# Patient Record
Sex: Male | Born: 1970 | Race: White | Hispanic: No | Marital: Married | Smoking: Never smoker
Health system: Southern US, Community
[De-identification: ages and names within clinical notes are randomized; demographics above are authoritative.]

## PROBLEM LIST (undated history)

## (undated) ENCOUNTER — Ambulatory Visit: Admission: EM | Payer: BC Managed Care – PPO | Source: Home / Self Care

## (undated) HISTORY — PX: EXCISION PERITONSILLAR CYST: SHX6266

---
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
## 2013-03-30 ENCOUNTER — Ambulatory Visit: Payer: Self-pay | Admitting: Emergency Medicine

## 2013-03-30 LAB — RAPID STREP-A WITH REFLX: MICRO TEXT REPORT: NEGATIVE

## 2013-04-02 LAB — BETA STREP CULTURE(ARMC)

## 2013-11-22 ENCOUNTER — Inpatient Hospital Stay: Payer: Self-pay | Admitting: Surgery

## 2013-11-22 LAB — URINALYSIS, COMPLETE
BLOOD: NEGATIVE
Bacteria: NONE SEEN
Bilirubin,UR: NEGATIVE
GLUCOSE, UR: NEGATIVE mg/dL (ref 0–75)
LEUKOCYTE ESTERASE: NEGATIVE
NITRITE: NEGATIVE
Ph: 5 (ref 4.5–8.0)
Protein: NEGATIVE
Specific Gravity: 1.012 (ref 1.003–1.030)
Squamous Epithelial: NONE SEEN
WBC UR: <1 /HPF (ref 0–5)

## 2013-11-22 LAB — CBC WITH DIFFERENTIAL/PLATELET
BASOS PCT: 0.4 %
Basophil #: 0.1 10*3/uL (ref 0.0–0.1)
EOS PCT: 1.1 %
Eosinophil #: 0.2 10*3/uL (ref 0.0–0.7)
HCT: 47.3 % (ref 40.0–52.0)
HGB: 16.1 g/dL (ref 13.0–18.0)
LYMPHS ABS: 2.2 10*3/uL (ref 1.0–3.6)
LYMPHS PCT: 14.7 %
MCH: 30.4 pg (ref 26.0–34.0)
MCHC: 34.1 g/dL (ref 32.0–36.0)
MCV: 89 fL (ref 80–100)
MONOS PCT: 7.7 %
Monocyte #: 1.2 x10 3/mm — ABNORMAL HIGH (ref 0.2–1.0)
NEUTROS ABS: 11.4 10*3/uL — AB (ref 1.4–6.5)
Neutrophil %: 76.1 %
Platelet: 118 10*3/uL — ABNORMAL LOW (ref 150–440)
RBC: 5.31 10*6/uL (ref 4.40–5.90)
RDW: 12.5 % (ref 11.5–14.5)
WBC: 15 10*3/uL — ABNORMAL HIGH (ref 3.8–10.6)

## 2013-11-22 LAB — COMPREHENSIVE METABOLIC PANEL
ALBUMIN: 4 g/dL (ref 3.4–5.0)
ALT: 46 U/L
ANION GAP: 10 (ref 7–16)
AST: 35 U/L (ref 15–37)
Alkaline Phosphatase: 153 U/L — ABNORMAL HIGH
BILIRUBIN TOTAL: 2.4 mg/dL — AB (ref 0.2–1.0)
BUN: 10 mg/dL (ref 7–18)
CO2: 30 mmol/L (ref 21–32)
Calcium, Total: 8.9 mg/dL (ref 8.5–10.1)
Chloride: 100 mmol/L (ref 98–107)
Creatinine: 1.02 mg/dL (ref 0.60–1.30)
EGFR (Non-African Amer.): >60
GLUCOSE: 88 mg/dL (ref 65–99)
Osmolality: 278 (ref 275–301)
POTASSIUM: 3.8 mmol/L (ref 3.5–5.1)
Sodium: 140 mmol/L (ref 136–145)
TOTAL PROTEIN: 8.1 g/dL (ref 6.4–8.2)

## 2013-11-22 LAB — TROPONIN I: Troponin-I: <0.02 ng/mL

## 2013-11-23 LAB — CBC WITH DIFFERENTIAL/PLATELET
Basophil #: 0 10*3/uL (ref 0.0–0.1)
Basophil %: 0.3 %
Eosinophil #: 0.2 10*3/uL (ref 0.0–0.7)
Eosinophil %: 1.3 %
HCT: 42.4 % (ref 40.0–52.0)
HGB: 14.5 g/dL (ref 13.0–18.0)
LYMPHS ABS: 1.5 10*3/uL (ref 1.0–3.6)
LYMPHS PCT: 11.7 %
MCH: 30.2 pg (ref 26.0–34.0)
MCHC: 34.4 g/dL (ref 32.0–36.0)
MCV: 88 fL (ref 80–100)
Monocyte #: 1 x10 3/mm (ref 0.2–1.0)
Monocyte %: 7.8 %
NEUTROS PCT: 78.9 %
Neutrophil #: 9.8 10*3/uL — ABNORMAL HIGH (ref 1.4–6.5)
Platelet: 109 10*3/uL — ABNORMAL LOW (ref 150–440)
RBC: 4.81 10*6/uL (ref 4.40–5.90)
RDW: 12.6 % (ref 11.5–14.5)
WBC: 12.4 10*3/uL — ABNORMAL HIGH (ref 3.8–10.6)

## 2013-11-23 LAB — COMPREHENSIVE METABOLIC PANEL
ALT: 35 U/L
AST: 19 U/L (ref 15–37)
Albumin: 3.3 g/dL — ABNORMAL LOW (ref 3.4–5.0)
Alkaline Phosphatase: 132 U/L — ABNORMAL HIGH
Anion Gap: 8 (ref 7–16)
BUN: 8 mg/dL (ref 7–18)
Bilirubin,Total: 2.6 mg/dL — ABNORMAL HIGH (ref 0.2–1.0)
CALCIUM: 8.4 mg/dL — AB (ref 8.5–10.1)
Chloride: 104 mmol/L (ref 98–107)
Co2: 29 mmol/L (ref 21–32)
Creatinine: 0.98 mg/dL (ref 0.60–1.30)
EGFR (African American): >60
EGFR (Non-African Amer.): >60
Glucose: 88 mg/dL (ref 65–99)
OSMOLALITY: 279 (ref 275–301)
POTASSIUM: 3.6 mmol/L (ref 3.5–5.1)
Sodium: 141 mmol/L (ref 136–145)
Total Protein: 6.8 g/dL (ref 6.4–8.2)

## 2013-11-24 LAB — CBC WITH DIFFERENTIAL/PLATELET
BASOS ABS: 0.1 10*3/uL (ref 0.0–0.1)
Basophil %: 0.9 %
Eosinophil #: 0.2 10*3/uL (ref 0.0–0.7)
Eosinophil %: 2.2 %
HCT: 40.7 % (ref 40.0–52.0)
HGB: 13.5 g/dL (ref 13.0–18.0)
Lymphocyte #: 1.6 10*3/uL (ref 1.0–3.6)
Lymphocyte %: 18.6 %
MCH: 29.8 pg (ref 26.0–34.0)
MCHC: 33.1 g/dL (ref 32.0–36.0)
MCV: 90 fL (ref 80–100)
Monocyte #: 0.6 x10 3/mm (ref 0.2–1.0)
Monocyte %: 7.3 %
NEUTROS ABS: 6.3 10*3/uL (ref 1.4–6.5)
Neutrophil %: 71 %
Platelet: 107 10*3/uL — ABNORMAL LOW (ref 150–440)
RBC: 4.52 10*6/uL (ref 4.40–5.90)
RDW: 12.5 % (ref 11.5–14.5)
WBC: 8.8 10*3/uL (ref 3.8–10.6)

## 2013-11-27 LAB — CULTURE, BLOOD (SINGLE)

## 2013-12-11 ENCOUNTER — Ambulatory Visit: Payer: Self-pay | Admitting: Surgery

## 2013-12-13 LAB — CLOSTRIDIUM DIFFICILE(ARMC)

## 2013-12-26 ENCOUNTER — Ambulatory Visit: Payer: Self-pay | Admitting: Gastroenterology

## 2014-01-15 ENCOUNTER — Ambulatory Visit: Payer: Self-pay | Admitting: Surgery

## 2014-01-15 LAB — CBC WITH DIFFERENTIAL/PLATELET
Basophil #: 0.1 10*3/uL (ref 0.0–0.1)
Basophil %: 0.9 %
EOS PCT: 2.3 %
Eosinophil #: 0.1 10*3/uL (ref 0.0–0.7)
HCT: 45.4 % (ref 40.0–52.0)
HGB: 15.1 g/dL (ref 13.0–18.0)
Lymphocyte #: 1.4 10*3/uL (ref 1.0–3.6)
Lymphocyte %: 21 %
MCH: 29.9 pg (ref 26.0–34.0)
MCHC: 33.3 g/dL (ref 32.0–36.0)
MCV: 90 fL (ref 80–100)
MONOS PCT: 5.8 %
Monocyte #: 0.4 x10 3/mm (ref 0.2–1.0)
Neutrophil #: 4.6 10*3/uL (ref 1.4–6.5)
Neutrophil %: 70 %
PLATELETS: 140 10*3/uL — AB (ref 150–440)
RBC: 5.05 10*6/uL (ref 4.40–5.90)
RDW: 12.9 % (ref 11.5–14.5)
WBC: 6.6 10*3/uL (ref 3.8–10.6)

## 2014-01-15 LAB — BASIC METABOLIC PANEL
ANION GAP: 6 — AB (ref 7–16)
BUN: 10 mg/dL (ref 7–18)
Calcium, Total: 9 mg/dL (ref 8.5–10.1)
Chloride: 105 mmol/L (ref 98–107)
Co2: 31 mmol/L (ref 21–32)
Creatinine: 1.05 mg/dL (ref 0.60–1.30)
GLUCOSE: 81 mg/dL (ref 65–99)
Osmolality: 281 (ref 275–301)
Potassium: 3.6 mmol/L (ref 3.5–5.1)
SODIUM: 142 mmol/L (ref 136–145)

## 2014-01-17 ENCOUNTER — Ambulatory Visit: Payer: Self-pay | Admitting: Surgery

## 2014-02-04 ENCOUNTER — Ambulatory Visit: Payer: Self-pay | Admitting: Urgent Care

## 2014-02-05 LAB — CLOSTRIDIUM DIFFICILE(ARMC)

## 2014-02-10 ENCOUNTER — Ambulatory Visit: Payer: Self-pay | Admitting: Urgent Care

## 2014-02-10 LAB — CLOSTRIDIUM DIFFICILE(ARMC)

## 2014-06-07 NOTE — H&P (Signed)
   Subjective/Chief Complaint Suprapubic/RLQ pain   History of Present Illness [name] is a pleasant 44 yo M who presents with 1 day of acute onset, worsening suprapubic, somewhat more RLQ pain.  Began acutely yesterday.  Worsening.  + fevers/chills.  + nausea/vomiting.  Last BM at 2 pm today which was normal.  Has never had before.  Worse with movement.  WBC 15, CT shows sigmoid colon inflammation and small pocket of contained air in pelvis.  Developed few wheals following IV contrast for CT scan, no other meds given.   Past History Tonsillectomy with peritonsillar abscess   Code Status Full Code   Past Med/Surgical Hx:  Denies medical history:   ALLERGIES:  No Known Allergies:   Family and Social History:  Family History Coronary Artery Disease  Hypertension  Cancer  Breast/colon cancer (colon cancers in advanced age)   Social History positive  tobacco, negative ETOH, + smokeless tobacco x 28 years   + Tobacco Current (within 1 year)   Place of Living Home  Here with significant other   Review of Systems:  Subjective/Chief Complaint Suprapubic pain, N/V, subjective fevers/chills   Fever/Chills Yes   Cough No   Sputum No   Abdominal Pain Yes   Diarrhea No   Constipation No   Nausea/Vomiting Yes   SOB/DOE No   Chest Pain No   Dysuria No   Tolerating Diet Nauseated  Vomiting   Physical Exam:  GEN well developed, well nourished, no acute distress   HEENT pink conjunctivae, PERRL, moist oral mucosa   RESP normal resp effort  clear BS  no use of accessory muscles   CARD regular rate  no murmur  no thrills  no carotid bruits  No LE edema   ABD positive tenderness  soft  normal BS  + suprapubic/infraumbilical tenderness   LYMPH negative neck, negative axillae   EXTR negative cyanosis/clubbing, negative edema   SKIN normal to palpation, + 3 wheals on abdomen   NEURO cranial nerves intact, negative Babinski R/L, negative rigidity, negative tremor,  follows commands   PSYCH A+O to time, place, person, good insight    Assessment/Admission Diagnosis 44 yo with acute onset abdominal pain, leukocytosis, sigmoid colon inflammation with contained perforation, likely diverticular.  Also with hyperbilirubinemia of unknown etiology.  Clinical and radiographic diverticulitis.   Plan NPO, IVF, IV abx.  Labs in am.  Serial abd exams.  Will need colonoscopy in 6 weeks.   Electronic Signatures: Jarvis NewcomerLundquist, Marrion Accomando A (MD)  (Signed 09-Oct-15 22:39)  Authored: CHIEF COMPLAINT and HISTORY, PAST MEDICAL/SURGIAL HISTORY, ALLERGIES, FAMILY AND SOCIAL HISTORY, REVIEW OF SYSTEMS, PHYSICAL EXAM, ASSESSMENT AND PLAN   Last Updated: 09-Oct-15 22:39 by Jarvis NewcomerLundquist, Karolyn Messing A (MD)

## 2016-08-16 IMAGING — CT CT ABD-PELV W/ CM
2 of 5 series · 16 of 46 positions shown, 18 images · IV contrast (isovue)
Comparison: 11/22/2013

CLINICAL DATA: History of ulcerative colitis, abdominal generalized
pain, colonoscopy 2 weeks ago

EXAM:
CT ABDOMEN AND PELVIS WITH CONTRAST
TECHNIQUE: Multidetector CT imaging of the abdomen and pelvis was performed
using the standard protocol following bolus administration of
intravenous contrast.
The patient has contrast allergy. There patient was premedicated
prior to IV contrast. No reaction to contrast.
CONTRAST:  100 cc Isovue

[Series 2: routine abd pel with · axial · 0.75mm/px · z∈[-1052,-652]mm · 13 of 90 slices shown, 15 images]
[im 5/90  soft-tissue]
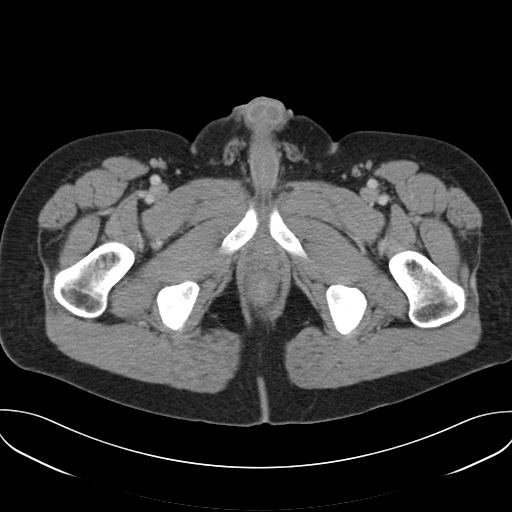
[im 5/90  bone]
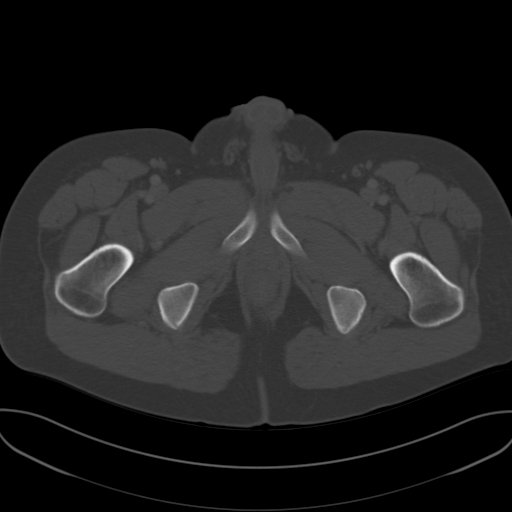
[im 10/90  soft-tissue]
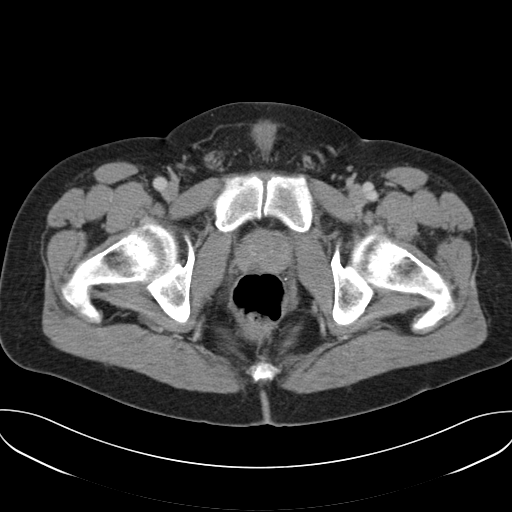
[im 20/90  soft-tissue]
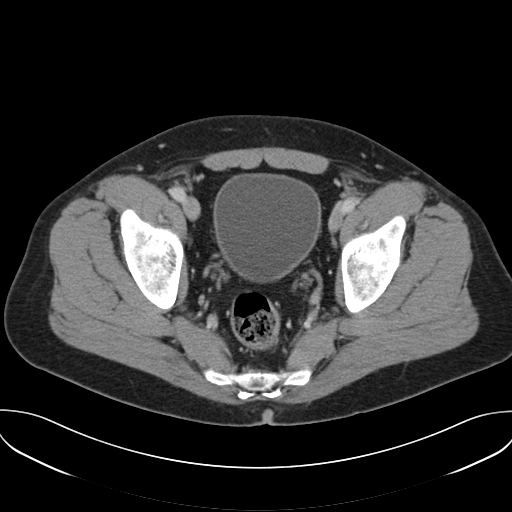
[im 25/90  soft-tissue]
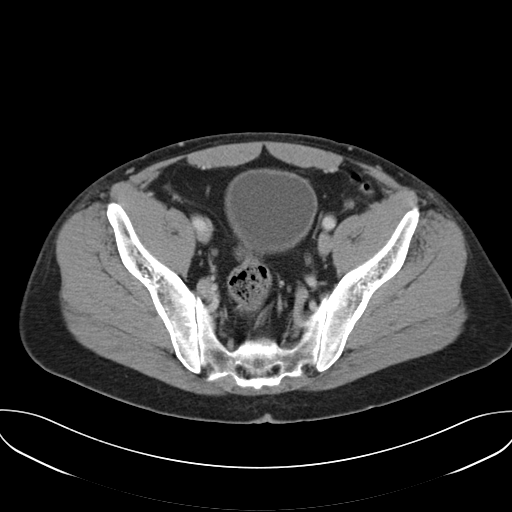
[im 30/90  soft-tissue]
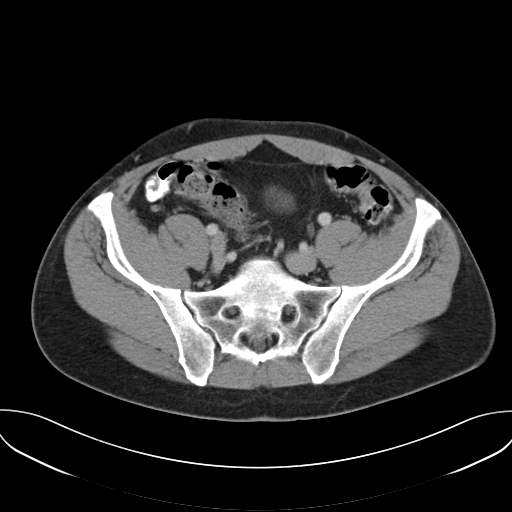
[im 40/90  soft-tissue]
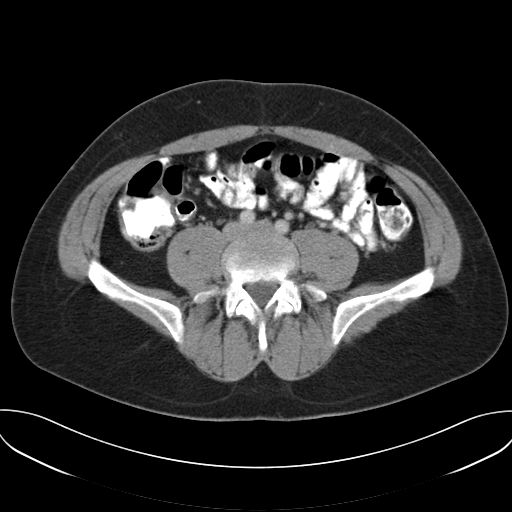
[im 45/90  soft-tissue]
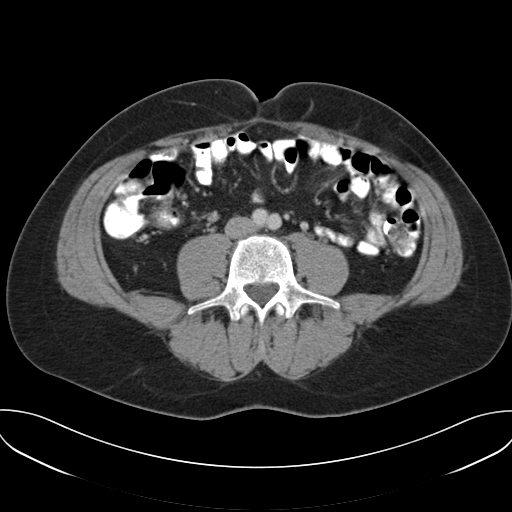
[im 50/90  soft-tissue]
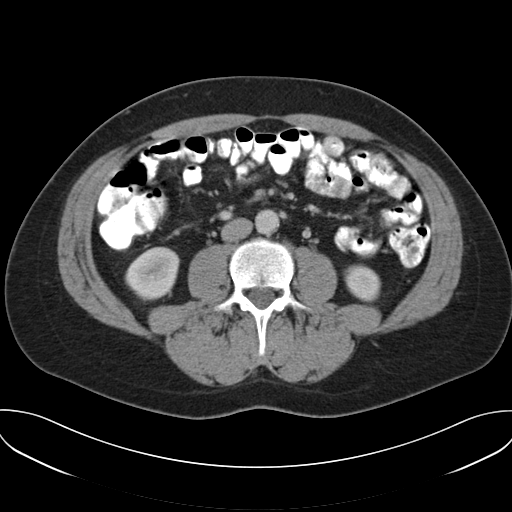
[im 60/90  soft-tissue]
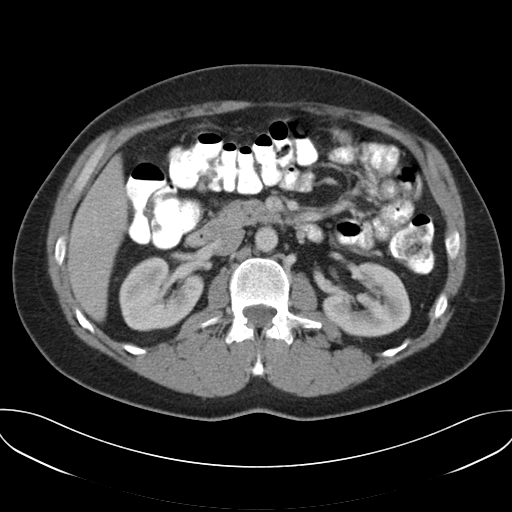
[im 60/90  bone]
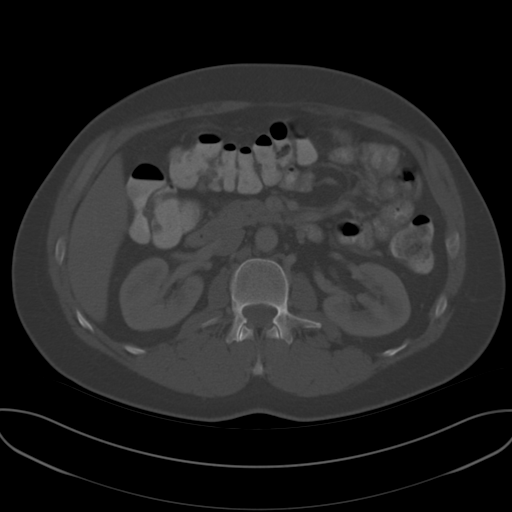
[im 65/90  soft-tissue]
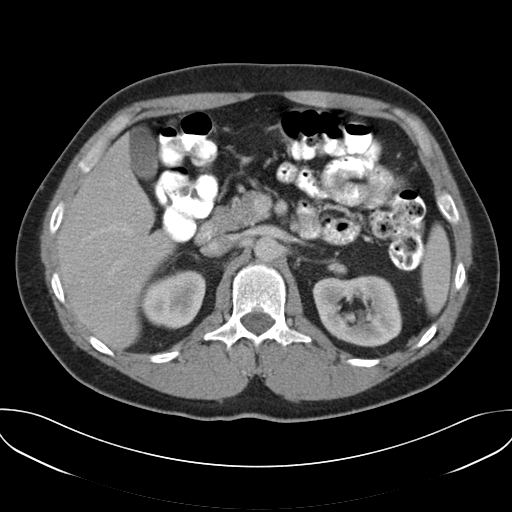
[im 70/90  soft-tissue]
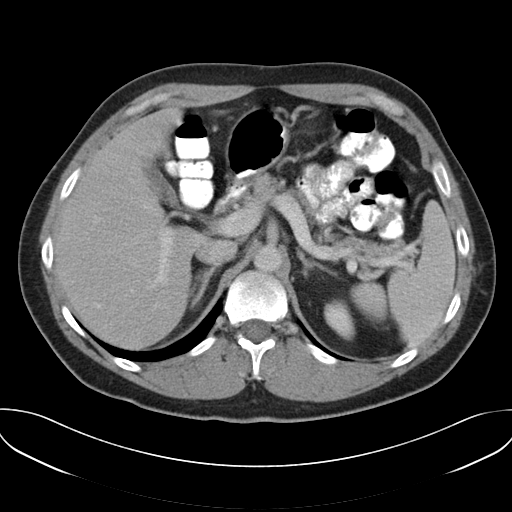
[im 80/90  soft-tissue]
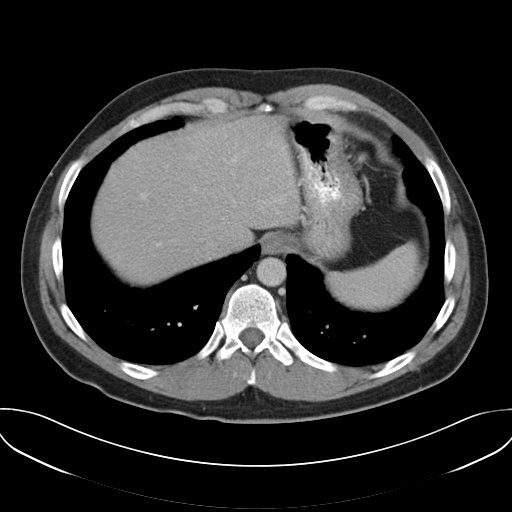
[im 85/90  soft-tissue]
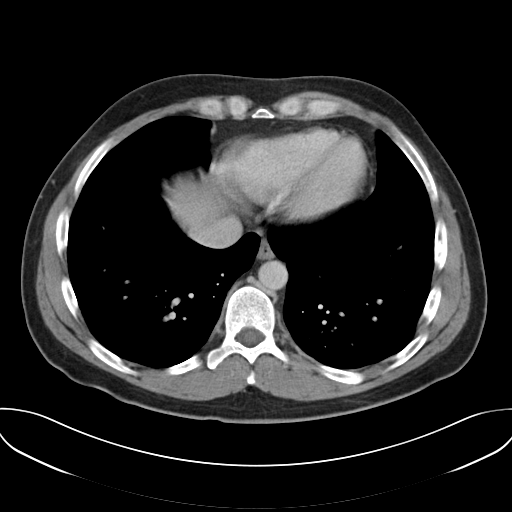

[Series 6: cor routine abd pel with · coronal · 0.70mm/px · 3 of 139 slices shown]
[im 47/139  soft-tissue]
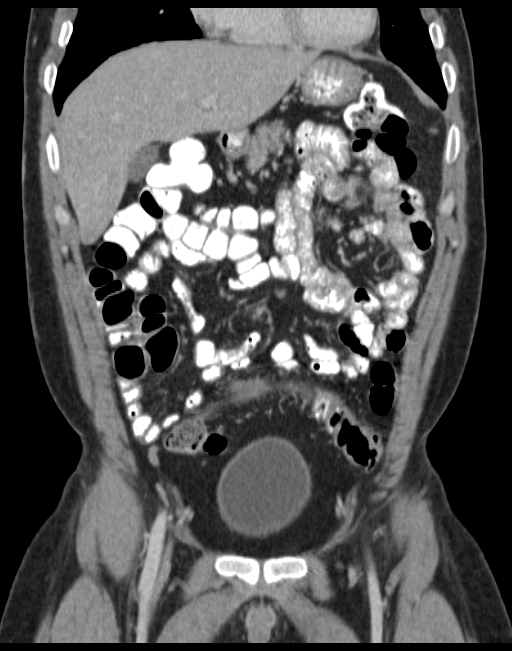
[im 62/139  soft-tissue]
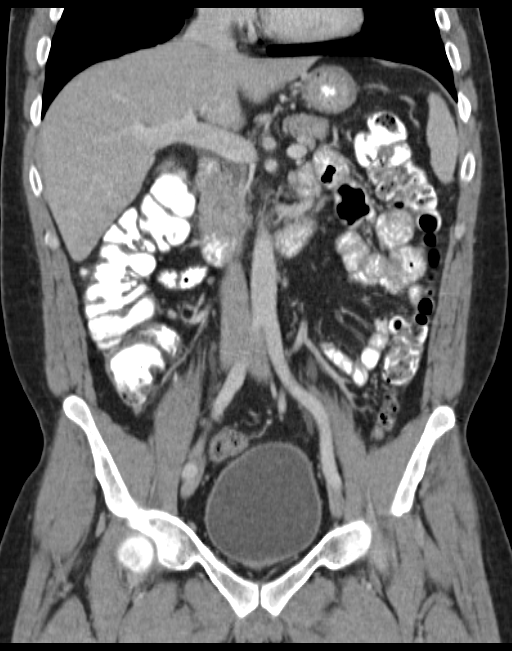
[im 77/139  soft-tissue]
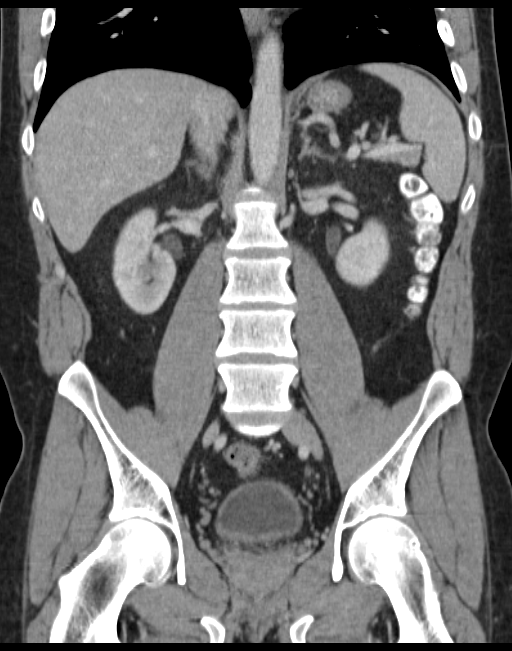

[16 of 46 positions shown; findings below may reference images not displayed]

FINDINGS: Sagittal images of the spine are unremarkable.

The lung bases are unremarkable.

Enhanced liver, pancreas, spleen and adrenal glands are
unremarkable. Kidneys are symmetrical in size and enhancement. No
hydronephrosis or hydroureter. No calcified gallstones are noted
within gallbladder. Abdominal aorta is unremarkable.

No small bowel obstruction. No adenopathy. No ascites or free air.
Terminal ileum is unremarkable. Normal appendix clearly visualized.
There is some stool in distal sigmoid colon. No evidence of colitis
or diverticulitis. Previous perforated diverticulitis has resolved.
The urinary bladder is unremarkable. Bilateral distal ureter is
unremarkable. Prostate gland and seminal vesicles are unremarkable.

Delayed renal images shows bilateral renal symmetrical excretion.
Bilateral visualized proximal ureter is unremarkable.
IMPRESSION: 1. No acute inflammatory process within abdomen or pelvis.
2. Normal appendix.  No pericecal inflammation.
3. No colitis or diverticulitis. Some stool noted in distal sigmoid
colon and rectum.
4. No hydronephrosis or hydroureter. Bilateral renal symmetrical
excretion.

## 2017-03-26 ENCOUNTER — Other Ambulatory Visit: Payer: Self-pay

## 2017-03-26 ENCOUNTER — Ambulatory Visit
Admission: EM | Admit: 2017-03-26 | Discharge: 2017-03-26 | Disposition: A | Discharge status: Home / Self Care | Payer: BLUE CROSS/BLUE SHIELD | Attending: Family Medicine | Admitting: Family Medicine

## 2017-03-26 DIAGNOSIS — R059 Cough, unspecified: Secondary | ICD-10-CM

## 2017-03-26 DIAGNOSIS — R05 Cough: Secondary | ICD-10-CM

## 2017-03-26 DIAGNOSIS — J0100 Acute maxillary sinusitis, unspecified: Secondary | ICD-10-CM

## 2017-03-26 MED ORDER — AMOXICILLIN 875 MG PO TABS: 875.0000 mg | ORAL_TABLET | Freq: Two times a day (BID) | ORAL | 0 refills | Status: DC

## 2017-03-26 NOTE — ED Provider Notes (Signed)
[facility]    CSN: [id number] Arrival date & time: 03/26/17  1409     History   Chief Complaint Chief Complaint  Patient presents with  . Cough    HPI Rodney Weber is a 47 y.o. male.   The history is provided by the patient.  Cough  Associated symptoms: no wheezing   URI  Presenting symptoms: congestion, cough, facial pain and fatigue   Severity:  Moderate Onset quality:  Sudden Duration:  3 weeks Timing:  Constant Progression:  Worsening Chronicity:  New Relieved by:  Nothing Ineffective treatments:  OTC medications Associated symptoms: sinus pain   Associated symptoms: no wheezing   Risk factors: sick contacts   Risk factors: not elderly, no chronic cardiac disease, no chronic kidney disease, no chronic respiratory disease, no diabetes mellitus, no immunosuppression, no recent illness and no recent travel     History reviewed. No pertinent past medical history.  There are no active problems to display for this patient.   Past Surgical History:  Procedure Laterality Date  . EXCISION PERITONSILLAR CYST         Home Medications    Prior to Admission medications   Medication Sig Start Date End Date Taking? Authorizing Provider  amoxicillin (AMOXIL) 875 MG tablet Take 1 tablet (875 mg total) by mouth 2 (two) times daily. 03/26/17   Payton Mccallumonty, Epsie Walthall, MD    Family History History reviewed. No pertinent family history.  Social History Social History   Tobacco Use  . Smoking status: Never Smoker  . Smokeless tobacco: Current User    Types: Chew  Substance Use Topics  . Alcohol use: Yes    Comment: rare  . Drug use: No     Allergies   Other   Review of Systems Review of Systems  Constitutional: Positive for fatigue.  HENT: Positive for congestion and sinus pain.   Respiratory: Positive for cough. Negative for wheezing.      Physical Exam Triage Vital Signs ED Triage Vitals  Enc Vitals Group     BP 03/26/17 1436 102/67       Pulse Rate 03/26/17 1436 64     Resp --      Temp 03/26/17 1436 98.2 F (36.8 C)     Temp Source 03/26/17 1436 Oral     SpO2 03/26/17 1436 99 %     Weight 03/26/17 1433 215 lb (97.5 kg)     Height 03/26/17 1433 6' (1.829 m)     Head Circumference --      Peak Flow --      Pain Score 03/26/17 1509 0     Pain Loc --      Pain Edu? --      Excl. in GC? --    No data found.  Updated Vital Signs BP 102/67 (BP Location: Left Arm)   Pulse 64   Temp 98.2 F (36.8 C) (Oral)   Ht 6' (1.829 m)   Wt 215 lb (97.5 kg)   SpO2 99%   BMI 29.16 kg/m   Visual Acuity Right Eye Distance:   Left Eye Distance:   Bilateral Distance:    Right Eye Near:   Left Eye Near:    Bilateral Near:     Physical Exam  Constitutional: He appears well-developed and well-nourished. No distress.  HENT:  Head: Normocephalic and atraumatic.  Right Ear: Tympanic membrane, external ear and ear canal normal.  Left Ear: Tympanic membrane, external ear and ear canal normal.  Nose: Mucosal edema and rhinorrhea present. Right sinus exhibits maxillary sinus tenderness and frontal sinus tenderness. Left sinus exhibits maxillary sinus tenderness and frontal sinus tenderness.  Mouth/Throat: Uvula is midline, oropharynx is clear and moist and mucous membranes are normal. No oropharyngeal exudate or tonsillar abscesses.  Eyes: Conjunctivae and EOM are normal. Pupils are equal, round, and reactive to light. Right eye exhibits no discharge. Left eye exhibits no discharge. No scleral icterus.  Neck: Normal range of motion. Neck supple. No tracheal deviation present. No thyromegaly present.  Cardiovascular: Normal rate, regular rhythm and normal heart sounds.  Pulmonary/Chest: Effort normal and breath sounds normal. No stridor. No respiratory distress. He has no wheezes. He has no rales. He exhibits no tenderness.  Lymphadenopathy:    He has no cervical adenopathy.  Neurological: He is alert.  Skin: Skin is warm and  dry. No rash noted. He is not diaphoretic.  Nursing note and vitals reviewed.    UC Treatments / Results  Labs (all labs ordered are listed, but only abnormal results are displayed) Labs Reviewed - No data to display  EKG  EKG Interpretation None       Radiology No results found.  Procedures Procedures (including critical care time)  Medications Ordered in UC Medications - No data to display   Initial Impression / Assessment and Plan / UC Course  I have reviewed the triage vital signs and the nursing notes.  Pertinent labs & imaging results that were available during my care of the patient were reviewed by me and considered in my medical decision making (see chart for details).       Final Clinical Impressions(s) / UC Diagnoses   Final diagnoses:  Acute maxillary sinusitis, recurrence not specified  Cough    ED Discharge Orders        Ordered    amoxicillin (AMOXIL) 875 MG tablet  2 times daily     03/26/17 1507     1. diagnosis reviewed with patient 2. rx as per orders above; reviewed possible side effects, interactions, risks and benefits  3. Recommend supportive treatment with otc flonase, otc cough med 4. Follow-up prn if symptoms worsen or don't improve  Controlled Substance Prescriptions [place] Controlled Substance Registry consulted? Not Applicable   Payton Mccallum, MD 03/26/17 [phone]

## 2017-03-26 NOTE — ED Triage Notes (Signed)
Patient c/o cough and congestion x 3 weeks.

## 2018-03-17 ENCOUNTER — Encounter: Payer: Self-pay | Admitting: Gynecology

## 2018-03-17 ENCOUNTER — Ambulatory Visit
Admission: EM | Admit: 2018-03-17 | Discharge: 2018-03-17 | Disposition: A | Discharge status: Home / Self Care | Payer: BLUE CROSS/BLUE SHIELD | Attending: Emergency Medicine | Admitting: Emergency Medicine

## 2018-03-17 ENCOUNTER — Other Ambulatory Visit: Payer: Self-pay

## 2018-03-17 DIAGNOSIS — J069 Acute upper respiratory infection, unspecified: Secondary | ICD-10-CM | POA: Diagnosis not present

## 2018-03-17 LAB — RAPID INFLUENZA A&B ANTIGENS
Influenza A (ARMC): NEGATIVE
Influenza B (ARMC): NEGATIVE

## 2018-03-17 LAB — RAPID STREP SCREEN (MED CTR MEBANE ONLY): Streptococcus, Group A Screen (Direct): NEGATIVE

## 2018-03-17 MED ORDER — IBUPROFEN 600 MG PO TABS: 600.0000 mg | ORAL_TABLET | Freq: Four times a day (QID) | ORAL | 0 refills | Status: DC | PRN

## 2018-03-17 MED ORDER — HYDROCOD POLST-CPM POLST ER 10-8 MG/5ML PO SUER: 5.0000 mL | Freq: Two times a day (BID) | ORAL | 0 refills | Status: DC | PRN

## 2018-03-17 MED ORDER — FLUTICASONE PROPIONATE 50 MCG/ACT NA SUSP: 2.0000 | Freq: Every day | NASAL | 0 refills | Status: DC

## 2018-03-17 NOTE — ED Provider Notes (Signed)
HPI  SUBJECTIVE:  Rodney Weber is a 48 y.o. male who presents with nasal congestion, rhinorrhea, postnasal drip, sore throat, headaches for the past 4 to 5 days.  States that he has felt feverish with chills, but did not check his temperature.  He reports a raspy voice and a cough productive of yellowish-brown mucus in the morning, clear in the afternoon.  States it is the same material as his nasal congestion.  No body aches, neck stiffness, drooling, trismus, sensation of throat swelling shut, difficulty breathing.  No wheezing, chest pain, shortness of breath.  No abdominal pain, rash.  He did not get a flu shot this year.  No contacts with strep.  States that he is unable to sleep at night secondary to the cough.  No antibiotics in the past month.  No antipyretic in the past 4 to 6 hours.  He tried Coricidin high blood pressure, and Zicam without improvement in his symptoms.  No aggravating factors.  He has a past medical history of peritonsillar abscess status post tonsillectomy.  No history of hypertension, pulmonary disease, smoking, diabetes, allergies, GERD.  PMD: None.    History reviewed. No pertinent past medical history.  Past Surgical History:  Procedure Laterality Date  . EXCISION PERITONSILLAR CYST      History reviewed. No pertinent family history.  Social History   Tobacco Use  . Smoking status: Never Smoker  . Smokeless tobacco: Current User    Types: Chew  Substance Use Topics  . Alcohol use: Yes    Comment: rare  . Drug use: No    No current facility-administered medications for this encounter.   Current Outpatient Medications:  .  amoxicillin (AMOXIL) 875 MG tablet, Take 1 tablet (875 mg total) by mouth 2 (two) times daily., Disp: 20 tablet, Rfl: 0  Allergies  Allergen Reactions  . Other     CT reactive dye causes rash     ROS  As noted in HPI.   Physical Exam  BP 111/79 (BP Location: Left Arm)   Pulse 77   Temp 99.2 F (37.3 C) (Oral)    Resp 16   Ht 6' (1.829 m)   Wt 95.3 kg   SpO2 100%   BMI 28.48 kg/m   Constitutional: Well developed, well nourished, no acute distress Eyes:  EOMI, conjunctiva normal bilaterally HENT: Normocephalic, atraumatic,mucus membranes moist.  Mucoid nasal congestion.  Erythematous, but not swollen turbinates.  No maxillary or frontal sinus tenderness.  Tonsils surgically absent.  Slightly erythematous oropharynx.  No cobblestoning, postnasal drip. Neck: No cervical lymphadenopathy. Respiratory: Normal inspiratory effort lungs clear bilaterally, good air movement Cardiovascular: Normal rate and rhythm, no murmurs GI: nondistended skin: No rash, skin intact Musculoskeletal: no deformities Neurologic: Alert & oriented x 3, no focal neuro deficits Psychiatric: Speech and behavior appropriate   ED Course   Medications - No data to display  Orders Placed This Encounter  Procedures  . Rapid Strep Screen ([facility] ONLY)    Standing Status:   Standing    Number of Occurrences:   1  . Rapid Influenza A&B Antigens ([facility] only)    Standing Status:   Standing    Number of Occurrences:   1  . Culture, group A strep    Standing Status:   Standing    Number of Occurrences:   1  . Droplet precaution    Standing Status:   Standing    Number of Occurrences:   1  Results for orders placed or performed during the hospital encounter of 03/17/18 (from the past 24 hour(s))  Rapid Strep Screen ([facility] ONLY)     Status: None   Collection Time: 03/17/18  3:52 PM  Result Value Ref Range   Streptococcus, Group A Screen (Direct) NEGATIVE NEGATIVE  Rapid Influenza A&B Antigens ([facility] only)     Status: None   Collection Time: 03/17/18  3:52 PM  Result Value Ref Range   Influenza A ([facility]) NEGATIVE NEGATIVE   Influenza B ([facility]) NEGATIVE NEGATIVE   No results found.  ED Clinical Impression  Upper respiratory tract infection, unspecified type   ED Assessment/Plan  [place] Narcotic database  reviewed for this patient, and feel that the risk/benefit ratio today is favorable for proceeding with a prescription for controlled substance.  No opiate prescriptions in 2 years.  Flu, strep negative.  Presentation consistent with URI.  Home with supportive treatment including regular Mucinex, saline nasal irrigation, ibuprofen 600 mg combined with 1 g of Tylenol 3 or 4 times a day as needed, Flonase, Tussionex.  Will provide primary care referral list.  May return here in 5 to 6 days if not feeling better we can consider antibiotics at that time.   Discussed labs,, MDM, treatment plan, and plan for follow-up with patient. patient agrees with plan.   No orders of the defined types were placed in this encounter.   *This clinic note was created using Dragon dictation software. Therefore, there may be occasional mistakes despite careful proofreading.   ?    Domenick Gong, MD 03/17/18 [phone]

## 2018-03-17 NOTE — ED Triage Notes (Signed)
Patient c/o cough Rodney Weber throat/ fever.

## 2018-03-17 NOTE — Discharge Instructions (Addendum)
regular Mucinex, saline nasal irrigation, ibuprofen 600 mg combined with 1 g of Tylenol 3 or 4 times a day as needed, Flonase, Tussionex.  You can stop the Coricidin high blood pressure.  May return here in 5 to 6 days if not feeling better we can consider antibiotics at that time.  Otherwise, follow-up with a primary care physician of your choice.  See list below.  Here is a list of primary care providers who are taking new patients:  Dr. Elizabeth Sauer, Dr. Schuyler Amor [address] [place] [address] [phone]  [facility] [address] [place] [address]  [phone]  [facility] [address] [place], [place] [address] [phone]  [facility] [address] [place]  [phone] [place], [place] [address]  Here are clinics/ other resources who will see you if you do not have insurance. Some have certain criteria that you must meet. Call them and find out what they are:  [facility]: [address] [place], [place] [address] Phone: [phone] Hours: First and Third Saturdays of each Month, 9 a.m. - 1 p.m.  [facility]: [address], [address] [place], [place] [address] Phone: [phone] Hours: Tuesday, 4 p.m. - 8 p.m. Thursday, 1 p.m. - 8 p.m. Wednesday, 9 a.m. - [facility] [address] [place], [place] [address] Phone: [phone] Pharmacy Phone Number: [phone] Dental Phone Number: [phone] [facility] Insurance Help: [phone]  Dental Hours: Monday - Thursday, 8 a.m. - 6 p.m.  [facility] [address] [place], [place] [address] Phone: [phone] Pharmacy Phone Number: [phone] [facility] Insurance Help: [phone]  [facility] [address] [place], [place] [address] Phone: [phone] Pharmacy Phone Number: [phone] [facility] Insurance Help: [phone]  [facility] [address] [place], [place] [address] Phone: [phone] [facility] Insurance Help: [phone]   [facility] [address] [place], [place] [address] Phone: [phone]  Go to www.goodrx.com to look up your medications. This will give you a list of where you can find your prescriptions at the most affordable prices. Or ask the pharmacist what the cash price is, or if they have any other discount programs available to help make your medication more affordable. This can be less expensive than what you would pay with insurance.

## 2018-03-20 LAB — CULTURE, GROUP A STREP (THRC)

## 2020-10-01 ENCOUNTER — Ambulatory Visit
Admission: EM | Admit: 2020-10-01 | Discharge: 2020-10-01 | Disposition: A | Discharge status: Home / Self Care | Payer: BC Managed Care – PPO | Attending: Family Medicine | Admitting: Family Medicine

## 2020-10-01 ENCOUNTER — Other Ambulatory Visit: Payer: Self-pay

## 2020-10-01 DIAGNOSIS — J988 Other specified respiratory disorders: Secondary | ICD-10-CM | POA: Diagnosis not present

## 2020-10-01 DIAGNOSIS — U071 COVID-19: Secondary | ICD-10-CM | POA: Insufficient documentation

## 2020-10-01 MED ORDER — CETIRIZINE-PSEUDOEPHEDRINE ER 5-120 MG PO TB12: 1.0000 | ORAL_TABLET | Freq: Two times a day (BID) | ORAL | 0 refills | Status: DC

## 2020-10-01 MED ORDER — AMOXICILLIN-POT CLAVULANATE 875-125 MG PO TABS: 1.0000 | ORAL_TABLET | Freq: Two times a day (BID) | ORAL | 0 refills | Status: DC

## 2020-10-01 NOTE — ED Triage Notes (Signed)
Pt here with C/O Chest congestion, nasal congestion, cough since Monday, sore throat since this morning. At home test negative. Has tried OTC medication Claritin and cough drops with no relief.

## 2020-10-01 NOTE — ED Provider Notes (Signed)
[facility]    CSN: [id number] Arrival date & time: 10/01/20  0841      History   Chief Complaint Chief Complaint  Patient presents with   Cough   Sore Throat   Nasal Congestion    HPI 50 year old male presents with respiratory symptoms.  Started on Monday.  He reports dry cough, postnasal drip, congestion, sore throat, ear pain.  No fever.  At home COVID test negative.  No reported sick contacts.  He has tried Claritin and some cough drops without resolution.  Noted exacerbating factors.  No other complaints or concerns at this time.   Home Medications    Prior to Admission medications   Medication Sig Start Date End Date Taking? Authorizing Provider  amoxicillin-clavulanate (AUGMENTIN) 875-125 MG tablet Take 1 tablet by mouth 2 (two) times daily. 10/01/20  Yes Adrinne Sze G, DO  cetirizine-pseudoephedrine (ZYRTEC-D) 5-120 MG tablet Take 1 tablet by mouth 2 (two) times daily. 10/01/20  Yes Tommie Sams, DO   Social History Social History   Tobacco Use   Smoking status: Never   Smokeless tobacco: Current    Types: Chew  Substance Use Topics   Alcohol use: Yes    Comment: rare   Drug use: No     Allergies   Other   Review of Systems Review of Systems Per HPI  Physical Exam Triage Vital Signs ED Triage Vitals  Enc Vitals Group     BP 10/01/20 0858 102/71     Pulse Rate 10/01/20 0858 66     Resp 10/01/20 0858 16     Temp 10/01/20 0858 98.4 F (36.9 C)     Temp Source 10/01/20 0858 Oral     SpO2 10/01/20 0858 100 %     Weight 10/01/20 0856 215 lb (97.5 kg)     Height 10/01/20 0856 6' (1.829 m)     Head Circumference --      Peak Flow --      Pain Score 10/01/20 0856 5     Pain Loc --      Pain Edu? --      Excl. in GC? --    Updated Vital Signs BP 102/71 (BP Location: Right Arm)   Pulse 66   Temp 98.4 F (36.9 C) (Oral)   Resp 16   Ht 6' (1.829 m)   Wt 97.5 kg   SpO2 100%   BMI 29.16 kg/m   Visual Acuity Right Eye Distance:    Left Eye Distance:   Bilateral Distance:    Right Eye Near:   Left Eye Near:    Bilateral Near:     Physical Exam Constitutional:      General: He is not in acute distress.    Appearance: Normal appearance. He is well-developed. He is not ill-appearing.  HENT:     Head: Normocephalic and atraumatic.     Right Ear: Tympanic membrane normal.     Left Ear: Tympanic membrane normal.     Mouth/Throat:     Pharynx: Posterior oropharyngeal erythema present.  Eyes:     General:        Right eye: No discharge.        Left eye: No discharge.     Conjunctiva/sclera: Conjunctivae normal.  Cardiovascular:     Rate and Rhythm: Normal rate and regular rhythm.     Heart sounds: No murmur heard. Pulmonary:     Effort: Pulmonary effort is normal.     Breath  sounds: Normal breath sounds. No wheezing, rhonchi or rales.  Neurological:     Mental Status: He is alert.  Psychiatric:        Mood and Affect: Mood normal.        Behavior: Behavior normal.     UC Treatments / Results  Labs (all labs ordered are listed, but only abnormal results are displayed) Labs Reviewed  SARS CORONAVIRUS 2 (TAT 6-24 HRS)    EKG   Radiology No results found.  Procedures Procedures (including critical care time)  Medications Ordered in UC Medications - No data to display  Initial Impression / Assessment and Plan / UC Course  I have reviewed the triage vital signs and the nursing notes.  Pertinent labs & imaging results that were available during my care of the patient were reviewed by me and considered in my medical decision making (see chart for details).    50 year old male presents with respiratory infection.  Awaiting COVID test results.  Placing empirically on Augmentin and Zyrtec-D.  Final Clinical Impressions(s) / UC Diagnoses   Final diagnoses:  Respiratory infection     Discharge Instructions      Medication as prescribed.  Results from the COVID test should be back  tomorrow.  Take care  Dr. Adriana Simas    ED Prescriptions     Medication Sig Dispense Auth. Provider   amoxicillin-clavulanate (AUGMENTIN) 875-125 MG tablet Take 1 tablet by mouth 2 (two) times daily. 14 tablet Wang Granada G, DO   cetirizine-pseudoephedrine (ZYRTEC-D) 5-120 MG tablet Take 1 tablet by mouth 2 (two) times daily. 30 tablet Tommie Sams, DO      PDMP not reviewed this encounter.   Tommie Sams, DO 10/01/20 1027

## 2020-10-01 NOTE — Discharge Instructions (Addendum)
Medication as prescribed.  Results from the COVID test should be back tomorrow.  Take care  Dr. Adriana Simas

## 2020-10-02 LAB — SARS CORONAVIRUS 2 (TAT 6-24 HRS): SARS Coronavirus 2: POSITIVE — AB

## 2021-03-05 ENCOUNTER — Ambulatory Visit: Payer: Self-pay | Admitting: Urology

## 2021-03-09 ENCOUNTER — Ambulatory Visit: Payer: BC Managed Care – PPO | Admitting: Urology

## 2021-03-09 ENCOUNTER — Other Ambulatory Visit: Payer: Self-pay

## 2021-03-09 ENCOUNTER — Encounter: Payer: Self-pay | Admitting: Urology

## 2021-03-09 ENCOUNTER — Other Ambulatory Visit
Admission: RE | Admit: 2021-03-09 | Discharge: 2021-03-09 | Disposition: A | Discharge status: Home / Self Care | Payer: BC Managed Care – PPO | Attending: Urology | Admitting: Urology

## 2021-03-09 VITALS — BP 118/72 | HR 78 | Ht 72.0 in | Wt 218.0 lb

## 2021-03-09 DIAGNOSIS — Z8042 Family history of malignant neoplasm of prostate: Secondary | ICD-10-CM

## 2021-03-09 DIAGNOSIS — N529 Male erectile dysfunction, unspecified: Secondary | ICD-10-CM | POA: Insufficient documentation

## 2021-03-09 DIAGNOSIS — Z125 Encounter for screening for malignant neoplasm of prostate: Secondary | ICD-10-CM | POA: Diagnosis not present

## 2021-03-09 MED ORDER — TADALAFIL 5 MG PO TABS: 5.0000 mg | ORAL_TABLET | Freq: Every day | ORAL | 11 refills | Status: DC

## 2021-03-09 NOTE — Progress Notes (Signed)
° °  03/09/21 10:17 AM   Rodney Weber 1970/05/24 [id number]  CC: PSA screening, family history of prostate cancer, ED  HPI: Healthy 51 year old male here to discuss the above issues.  He reportedly has a history of lethal prostate cancer in his father, and has a number of questions today about PSA screening.  PSA has been normal, most recently 1.08 in April 2022 which was stable from 1.15 the year prior.  He has his PSA drawn yearly in April with PCP visit.  He denies any significant urinary symptoms.  His main complaint today is erectile dysfunction over at least the last year.  He has never tried medications for this.  He has difficulty both obtaining and maintaining an erection.  No prior testosterone values to review.    Surgical History: Past Surgical History:  Procedure Laterality Date   EXCISION PERITONSILLAR CYST        Family History: No family history on file.  Social History:  reports that he has never smoked. His smokeless tobacco use includes chew. He reports current alcohol use. He reports that he does not use drugs.  Physical Exam: BP 118/72    Pulse 78    Ht 6' (1.829 m)    Wt 218 lb (98.9 kg)    BMI 29.57 kg/m    Constitutional:  Alert and oriented, No acute distress. Cardiovascular: No clubbing, cyanosis, or edema. Respiratory: Normal respiratory effort, no increased work of breathing. GI: Abdomen is soft, nontender, nondistended, no abdominal masses DRE: 30 g, smooth, no nodules or masses  Assessment & Plan:   51 year old male with family history of lethal prostate cancer here for routine PSA screening/DRE, as well as concerns for erectile dysfunction.  Reviewed the AUA guidelines regarding PSA screening, and his DRE is benign today.  I recommended continuing screening every 1 to 2 years with PCP.  He would like to have a yearly DRE performed, and his PCP has not performed these, and he would like to continue to see [place] yearly for a DRE.  We reviewed the  risks and benefits of screening at length.  Regarding his erectile dysfunction, I recommended a trial of Cialis 5 to 10 mg daily.  We discussed this dose can be titrated as needed, and if working well can ultimately just be taken as needed.  We also reviewed the AUA guidelines regarding ED that recommend a testosterone level, and will call with those results from today.  Trial of Cialis 5 to 10 mg daily for ED, he will contact us via MyChart or phone if no improvement Call with testosterone results RTC May 2024 for ongoing PSA surveillance, DRE, and ED symptom check  Legrand Rams, MD 03/09/2021  [facility] [address] [place], [place] [address] [phone]

## 2021-03-09 NOTE — Patient Instructions (Addendum)
PLEASE CALL BACK TO SCHEDULE APPOINTMENT IN MAY OF 2024   Prostate Cancer Screening Prostate cancer screening is testing that is done to check for the presence of prostate cancer in men. The prostate gland is a walnut-sized gland that is located below the bladder and in front of the rectum in males. The function of the prostate is to add fluid to semen during ejaculation. Prostate cancer is one of the most common types of cancer in men. Who should have prostate cancer screening? Screening recommendations vary based on age and other risk factors, as well as between the professional organizations who make the recommendations. In general, screening is recommended if: You are age 49 to 31 and have an average risk for prostate cancer. You should talk with your health care provider about your need for screening and how often screening should be done. Because most prostate cancers are slow growing and will not cause death, screening in this age group is generally reserved for men who have a 10- to 15-year life expectancy. You are younger than age 67, and you have these risk factors: Having a father, brother, or uncle who has been diagnosed with prostate cancer. The risk is higher if your family member's cancer occurred at an early age or if you have multiple family members with prostate cancer at an early age. Being a male who is [place] or is of [place] or sub-Saharan African descent. In general, screening is not recommended if: You are younger than age 59. You are between the ages of 63 and 31 and you have no risk factors. You are 65 years of age or older. At this age, the risks that screening can cause are greater than the benefits that it may provide. If you are at high risk for prostate cancer, your health care provider may recommend that you have screenings more often or that you start screening at a younger age. How is screening for prostate cancer done? The recommended prostate cancer screening  test is a blood test called the prostate-specific antigen (PSA) test. PSA is a protein that is made in the prostate. As you age, your prostate naturally produces more PSA. Abnormally high PSA levels may be caused by: Prostate cancer. An enlarged prostate that is not caused by cancer (benign prostatic hyperplasia, or BPH). This condition is very common in older men. A prostate gland infection (prostatitis) or urinary tract infection. Certain medicines such as male hormones (like testosterone) or other medicines that raise testosterone levels. A rectal exam may be done as part of prostate cancer screening to help provide information about the size of your prostate gland. When a rectal exam is performed, it should be done after the PSA level is drawn to avoid any effect on the results. Depending on the PSA results, you may need more tests, such as: A physical exam to check the size of your prostate gland, if not done as part of screening. Blood and imaging tests. A procedure to remove tissue samples from your prostate gland for testing (biopsy). This is the only way to know for certain if you have prostate cancer. What are the benefits of prostate cancer screening? Screening can help to identify cancer at an early stage, before symptoms start and when the cancer can be treated more easily. There is a small chance that screening may lower your risk of dying from prostate cancer. The chance is small because prostate cancer is a slow-growing cancer, and most men with prostate cancer die from a  different cause. What are the risks of prostate cancer screening? The main risk of prostate cancer screening is diagnosing and treating prostate cancer that would never have caused any symptoms or problems. This is called overdiagnosisand overtreatment. PSA screening cannot tell you if your PSA is high due to cancer or a different cause. A prostate biopsy is the only procedure to diagnose prostate cancer. Even the  results of a biopsy may not tell you if your cancer needs to be treated. Slow-growing prostate cancer may not need any treatment other than monitoring, so diagnosing and treating it may cause unnecessary stress or other side effects. Questions to ask your health care provider When should I start prostate cancer screening? What is my risk for prostate cancer? How often do I need screening? What type of screening tests do I need? How do I get my test results? What do my results mean? Do I need treatment? Where to find more information The American Cancer Society: www.cancer.org American Urological Association: www.auanet.org Contact a health care provider if: You have difficulty urinating. You have pain when you urinate or ejaculate. You have blood in your urine or semen. You have pain in your back or in the area of your prostate. Summary Prostate cancer is a common type of cancer in men. The prostate gland is located below the bladder and in front of the rectum. This gland adds fluid to semen during ejaculation. Prostate cancer screening may identify cancer at an early stage, when the cancer can be treated more easily and is less likely to have spread to other areas of the body. The prostate-specific antigen (PSA) test is the recommended screening test for prostate cancer, but it has associated risks. Discuss the risks and benefits of prostate cancer screening with your health care provider. If you are age 104 or older, the risks that screening can cause are greater than the benefits that it may provide. This information is not intended to replace advice given to you by your health care provider. Make sure you discuss any questions you have with your health care provider. Document Revised: 07/27/2020 Document Reviewed: 07/27/2020 Elsevier Patient Education  2022 Elsevier Inc.  Prostate-Specific Antigen Test Why am I having this test? The prostate-specific antigen (PSA) test is a screening  test for prostate cancer. It can identify early signs of prostate cancer, which may allow for early detection and more effective treatment. Your health care provider may recommend that you have a PSA test starting at age 62 or that you have one earlier if you are at higher risk for prostate cancer. You may also have a PSA test: To monitor treatment of prostate cancer. To check whether prostate cancer has returned after treatment. What is being tested? This test measures the amount of PSA in your blood. PSA is a protein that is made in the prostate. The prostate naturally produces more PSA as you age, but very high levels may be a sign of a medical condition. What kind of sample is taken? A blood sample is required for this test. It is usually collected by inserting a needle into a blood vessel but can also be collected by sticking a finger with a small needle. Blood for this test should be drawn before having an exam of the prostate that involves digital rectal examination to avoid affecting the results. How do I prepare for this test? Do not ejaculate starting 24 hours before your test, or as long as told by your health care provider, as  this can cause an elevation in PSA. Do not undergo any procedures that require manipulation of the prostate, such as biopsy or surgery, for 6 weeks before the test is done as this can cause an elevation in PSA. Tell a health care provider about: Any signs you may have of other conditions that can affect PSA levels, such as: An enlarged prostate that is not caused by cancer (benign prostatic hyperplasia, or BPH). This condition is very common in older men. A prostate or urinary tract infection. Any allergies you have. All medicines you are taking, including vitamins, herbs, eye drops, creams, and over-the-counter medicines. This also includes: Medicines to assist with hair growth, such as finasteride. Any recent exposure to a medicine called diethylstilbestrol  (DES). Medicines such as male hormones (like testosterone) or other medicines that raise testosterone levels. Any bleeding problems you have. Any recent procedures you have had, especially any procedures involving the prostate or rectum. Any medical conditions you have. How are the results reported? Your test results will be reported as a value that indicates how much PSA is in your blood. This will be given as nanograms of PSA per milliliter of blood (ng/mL). Your health care provider will compare your results to normal ranges that were established after testing a large group of people (reference ranges). Reference ranges may vary among labs and hospitals. PSA levels vary from person to person and generally increase with age. Because of this variation, there is no single PSA value that is considered normal for everyone. Instead, PSA reference ranges are used to describe whether your PSA levels are considered low or high (elevated). Common reference ranges are: Low: 0-2.5 ng/mL. Slightly to moderately elevated: 2.6-10.0 ng/mL. Moderately elevated: 10.0-19.9 ng/mL. Significantly elevated: 20 ng/mL or greater. What do the results mean? A test result that is higher than 4 ng/mL may mean that you have prostate cancer. However, a PSA test by itself is not enough to diagnose prostate cancer. High PSA levels may also be caused by the natural aging process, prostate infection (prostatitis), or BPH. PSA screening cannot tell you if your PSA is high due to cancer or a different cause. A prostate biopsy is the only way to diagnose prostate cancer. A risk of having the PSA test is diagnosing and treating prostate cancer that would never have caused any symptoms or problems (overdiagnosis and overtreatment). Talk with your health care provider about what your results mean. In some cases, your health care provider may do more testing to confirm the results. Questions to ask your health care provider Ask your  health care provider, or the department that is doing the test: When will my results be ready? How will I get my results? What are my treatment options? What other tests do I need? What are my next steps? Summary The prostate-specific antigen (PSA) test is a screening test for prostate cancer. Your health care provider may recommend that you have a PSA test starting at age 79 or that you have one earlier if you are at higher risk for prostate cancer. A test result that is higher than 4 ng/mL may mean that you have prostate cancer. However, elevated levels can be caused by a number of conditions other than prostate cancer. Talk with your health care provider about what your results mean. This information is not intended to replace advice given to you by your health care provider. Make sure you discuss any questions you have with your health care provider. Document Revised: 06/10/2020 Document  Reviewed: 06/10/2020 Elsevier Patient Education  2022 Elsevier Inc.   Erectile Dysfunction Erectile dysfunction (ED) is the inability to get or keep an erection in order to have sexual intercourse. ED is considered a symptom of an underlying disorder and is not considered a disease. ED may include: Inability to get an erection. Lack of enough hardness of the erection to allow penetration. Loss of erection before sex is finished. What are the causes? This condition may be caused by: Physical causes, such as: Artery problems. This may include heart disease, high blood pressure, atherosclerosis, and diabetes. Hormonal problems, such as low testosterone. Obesity. Nerve problems. This may include back or pelvic injuries, multiple sclerosis, Parkinson's disease, spinal cord injury, and stroke. Certain medicines, such as: Pain relievers. Antidepressants. Blood pressure medicines and water pills (diuretics). Cancer medicines. Antihistamines. Muscle relaxants. Lifestyle factors, such as: Use of drugs  such as marijuana, cocaine, or opioids. Excessive use of alcohol. Smoking. Lack of physical activity or exercise. Psychological causes, such as: Anxiety or stress. Sadness or depression. Exhaustion. Fear about sexual performance. Guilt. What are the signs or symptoms? Symptoms of this condition include: Inability to get an erection. Lack of enough hardness of the erection to allow penetration. Loss of the erection before sex is finished. Sometimes having normal erections, but with frequent unsatisfactory episodes. Low sexual satisfaction in either partner due to erection problems. A curved penis occurring with erection. The curve may cause pain, or the penis may be too curved to allow for intercourse. Never having nighttime or morning erections. How is this diagnosed? This condition is often diagnosed by: Performing a physical exam to find other diseases or specific problems with the penis. Asking you detailed questions about the problem. Doing tests, such as: Blood tests to check for diabetes mellitus or high cholesterol, or to measure hormone levels. Other tests to check for underlying health conditions. An ultrasound exam to check for scarring. A test to check blood flow to the penis. Doing a sleep study at home to measure nighttime erections. How is this treated? This condition may be treated by: Medicines, such as: Medicine taken by mouth to help you achieve an erection (oral medicine). Hormone replacement therapy to replace low testosterone levels. Medicine that is injected into the penis. Your health care provider may instruct you how to give yourself these injections at home. Medicine that is delivered with a short applicator tube. The tube is inserted into the opening at the tip of the penis, which is the opening of the urethra. A tiny pellet of medicine is put in the urethra. The pellet dissolves and enhances erectile function. This is also called MUSE (medicated urethral  system for erections) therapy. Vacuum pump. This is a pump with a ring on it. The pump and ring are placed on the penis and used to create pressure that helps the penis become erect. Penile implant surgery. In this procedure, you may receive: An inflatable implant. This consists of cylinders, a pump, and a reservoir. The cylinders can be inflated with a fluid that helps to create an erection, and they can be deflated after intercourse. A semi-rigid implant. This consists of two silicone rubber rods. The rods provide some rigidity. They are also flexible, so the penis can both curve downward in its normal position and become straight for sexual intercourse. Blood vessel surgery to improve blood flow to the penis. During this procedure, a blood vessel from a different part of the body is placed into the penis to allow  blood to flow around (bypass) damaged or blocked blood vessels. Lifestyle changes, such as exercising more, losing weight, and quitting smoking. Follow these instructions at home: Medicines  Take over-the-counter and prescription medicines only as told by your health care provider. Do not increase the dosage without first discussing it with your health care provider. If you are using self-injections, do injections as directed by your health care provider. Make sure you avoid any veins that are on the surface of the penis. After giving an injection, apply pressure to the injection site for 5 minutes. Talk to your health care provider about how to prevent headaches while taking ED medicines. These medicines may cause a sudden headache due to the increase in blood flow in your body. General instructions Exercise regularly, as directed by your health care provider. Work with your health care provider to lose weight, if needed. Do not use any products that contain nicotine or tobacco. These products include cigarettes, chewing tobacco, and vaping devices, such as e-cigarettes. If you need help  quitting, ask your health care provider. Before using a vacuum pump, read the instructions that come with the pump and discuss any questions with your health care provider. Keep all follow-up visits. This is important. Contact a health care provider if: You feel nauseous. You are vomiting. You get sudden headaches while taking ED medicines. You have any concerns about your sexual health. Get help right away if: You are taking oral or injectable medicines and you have an erection that lasts longer than 4 hours. If your health care provider is unavailable, go to the nearest emergency room for evaluation. An erection that lasts much longer than 4 hours can result in permanent damage to your penis. You have severe pain in your groin or abdomen. You develop redness or severe swelling of your penis. You have redness spreading at your groin or lower abdomen. You are unable to urinate. You experience chest pain or a rapid heartbeat (palpitations) after taking oral medicines. These symptoms may represent a serious problem that is an emergency. Do not wait to see if the symptoms will go away. Get medical help right away. Call your local emergency services (911 in the [place] Do not drive yourself to the hospital. Summary Erectile dysfunction (ED) is the inability to get or keep an erection during sexual intercourse. This condition is diagnosed based on a physical exam, your symptoms, and tests to determine the cause. Treatment varies depending on the cause and may include medicines, hormone therapy, surgery, or a vacuum pump. You may need follow-up visits to make sure that you are using your medicines or devices correctly. Get help right away if you are taking or injecting medicines and you have an erection that lasts longer than 4 hours. This information is not intended to replace advice given to you by your health care provider. Make sure you discuss any questions you have with your health care  provider. Document Revised: 04/29/2020 Document Reviewed: 04/29/2020 Elsevier Patient Education  2022 [employer]

## 2021-03-10 LAB — TESTOSTERONE: Testosterone: 417 ng/dL (ref 264–916)

## 2021-03-11 ENCOUNTER — Telehealth: Payer: Self-pay

## 2021-03-11 NOTE — Telephone Encounter (Signed)
See my chart message

## 2021-03-11 NOTE — Telephone Encounter (Signed)
-----   Message from Sondra Come, MD sent at 03/10/2021  8:14 AM EST ----- Good news, testosterone normal at 417.  If no improvement on the Cialis, okay to titrate dose up to 20 mg max dose, he should let us know via MyChart or phone if no improvement over the next month  Legrand Rams, MD 03/10/2021

## 2022-01-24 ENCOUNTER — Encounter: Payer: BC Managed Care – PPO | Admitting: Family Medicine

## 2022-01-27 ENCOUNTER — Encounter: Payer: Self-pay | Admitting: Family Medicine

## 2022-01-27 ENCOUNTER — Ambulatory Visit: Payer: BC Managed Care – PPO | Admitting: Family Medicine

## 2022-01-27 VITALS — BP 110/70 | HR 78 | Ht 72.0 in | Wt 223.0 lb

## 2022-01-27 DIAGNOSIS — M7711 Lateral epicondylitis, right elbow: Secondary | ICD-10-CM | POA: Diagnosis not present

## 2022-01-27 MED ORDER — MELOXICAM 15 MG PO TABS: 15.0000 mg | ORAL_TABLET | Freq: Every day | ORAL | 0 refills | Status: DC

## 2022-01-27 NOTE — Progress Notes (Signed)
     Primary Care / Sports Medicine Office Visit  Patient Information:  Patient ID: Rodney Weber, male DOB: May 25, 1970 Age: 51 y.o. MRN: [id number]   Rodney Weber is a pleasant 51 y.o. male presenting with the following:  Chief Complaint  Patient presents with   Elbow Pain    Numbness, weakness, right, for 3 months. No known injury. No imaging.     Vitals:   01/27/22 0812  BP: 110/70  Pulse: 78  SpO2: 98%   Vitals:   01/27/22 0812  Weight: 223 lb (101.2 kg)  Height: 6' (1.829 m)   Body mass index is 30.24 kg/m.  No results found.   Independent interpretation of notes and tests performed by another provider:   None  Procedures performed:   None  Pertinent History, Exam, Impression, and Recommendations:   Problem List Items Addressed This Visit       Musculoskeletal and Integument   Lateral epicondylitis, right elbow - Primary    RHD patient with 3 months atraumatic right lateral elbow and extensor forearm pain, progressive worsening. Has been resting and performing rehab exercises without resolution, dosing 800 mg ibuprofen daily as well.  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 Plan for counterforce brace, scheduled meloxicam, home exercises provided for start at 2-4 weeks if symptoms permit. Recalcitrant symptoms to be addressed with corticosteroid injection with consideration for formal PT.      Relevant Medications   meloxicam (MOBIC) 15 MG tablet     Orders & Medications Meds ordered this encounter  Medications   meloxicam (MOBIC) 15 MG tablet    Sig: Take 1 tablet (15 mg total) by mouth daily.    Dispense:  30 tablet    Refill:  0   No orders of the defined types were placed in this encounter.    No follow-ups on file.     Jerrol Banana, MD, [facility]   Primary Care Sports Medicine Primary Care and Sports Medicine at [facility]

## 2022-01-27 NOTE — Patient Instructions (Signed)
-   Use brace during wakeful / active hours - Start meloxicam and dose daily with food

## 2022-01-27 NOTE — Assessment & Plan Note (Signed)
RHD patient with 3 months atraumatic right lateral elbow and extensor forearm pain, progressive worsening. Has been resting and performing rehab exercises without resolution, dosing 800 mg ibuprofen daily as well.  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 Plan for counterforce brace, scheduled meloxicam, home exercises provided for start at 2-4 weeks if symptoms permit. Recalcitrant symptoms to be addressed with corticosteroid injection with consideration for formal PT.

## 2022-02-24 ENCOUNTER — Encounter: Payer: Self-pay | Admitting: Family Medicine

## 2022-02-24 ENCOUNTER — Ambulatory Visit: Payer: Commercial Managed Care - PPO | Admitting: Family Medicine

## 2022-02-24 VITALS — BP 110/70 | HR 76 | Ht 72.0 in | Wt 223.0 lb

## 2022-02-24 DIAGNOSIS — M7711 Lateral epicondylitis, right elbow: Secondary | ICD-10-CM

## 2022-02-24 MED ORDER — MELOXICAM 7.5 MG PO TABS: 7.5000 mg | ORAL_TABLET | Freq: Two times a day (BID) | ORAL | 1 refills | Status: AC | PRN

## 2022-02-24 NOTE — Progress Notes (Signed)
     Primary Care / Sports Medicine Office Visit  Patient Information:  Patient ID: CHARON AKAMINE, male DOB: 1971/02/08 Age: 52 y.o. MRN: [id number]   EIDAN MUELLNER is a pleasant 52 y.o. male presenting with the following:  Chief Complaint  Patient presents with   Lateral epicondylitis, right elbow    Is getting some relief, has not started PT    Vitals:   02/24/22 0824  BP: 110/70  Pulse: 76  SpO2: 98%   Vitals:   02/24/22 0824  Weight: 223 lb (101.2 kg)  Height: 6' (1.829 m)   Body mass index is 30.24 kg/m.  No results found.   Independent interpretation of notes and tests performed by another provider:   None  Procedures performed:   None  Pertinent History, Exam, Impression, and Recommendations:   Sasuke was seen today for lateral epicondylitis, right elbow.  Lateral epicondylitis, right elbow Assessment & Plan: Patient presents for follow-up to right lateral epicondylitis, at last visit close/14/2023 he was advised scheduled meloxicam regimen, counterforce brace, home exercises.  He cites roughly 10-15% improvement over interim but still faces significant interference with ADLs due to pain.  Examination with focal tenderness at the right lateral epicondyle, secondarily at the triceps insertion at the olecranon, medial epicondyle benign, provocative testing again consistent with lateral epicondylitis.  We spent a length of time discussing various next steps inclusive of continuing current management, incorporation of physical therapy, consideration of dry needling, Graston, inclusion of corticosteroid versus platelet rich plasma injections.  At this stage patient will start formal PT, consider dry needling and Graston techniques, seek information from his insurer regarding consideration of platelet rich plasma, and continue counterforce brace.  From a medication management standpoint transition meloxicam 50 mg daily to meloxicam 7.5 mg twice daily as  needed.  Orders: -     Ambulatory referral to Physical Therapy -     Meloxicam; Take 1 tablet (7.5 mg total) by mouth 2 (two) times daily as needed for pain.  Dispense: 60 tablet; Refill: 1   I provided a total time of 30 minutes including both face-to-face and non-face-to-face time on 02/24/2022 inclusive of time utilized for medical chart review, information gathering, care coordination with staff, and documentation completion.   Orders & Medications Meds ordered this encounter  Medications   meloxicam (MOBIC) 7.5 MG tablet    Sig: Take 1 tablet (7.5 mg total) by mouth 2 (two) times daily as needed for pain.    Dispense:  60 tablet    Refill:  1   Orders Placed This Encounter  Procedures   Ambulatory referral to Physical Therapy     Return in about 6 weeks (around 04/07/2022).     Montel Culver, MD, [facility]   Primary Care Sports Medicine Primary Care and Sports Medicine at [facility]

## 2022-02-24 NOTE — Assessment & Plan Note (Signed)
Patient presents for follow-up to right lateral epicondylitis, at last visit close/14/2023 he was advised scheduled meloxicam regimen, counterforce brace, home exercises.  He cites roughly 10-15% improvement over interim but still faces significant interference with ADLs due to pain.  Examination with focal tenderness at the right lateral epicondyle, secondarily at the triceps insertion at the olecranon, medial epicondyle benign, provocative testing again consistent with lateral epicondylitis.  We spent a length of time discussing various next steps inclusive of continuing current management, incorporation of physical therapy, consideration of dry needling, Graston, inclusion of corticosteroid versus platelet rich plasma injections.  At this stage patient will start formal PT, consider dry needling and Graston techniques, seek information from his insurer regarding consideration of platelet rich plasma, and continue counterforce brace.  From a medication management standpoint transition meloxicam 50 mg daily to meloxicam 7.5 mg twice daily as needed.

## 2022-02-24 NOTE — Patient Instructions (Addendum)
- Transition to as needed dosing of meloxicam 7.5 mg up to twice a day (take with food) - Can trial ice and Tylenol for additional pain control - Start physical therapy, recommend dry needling in addition to standard treatments - Continue elbow brace and activity as tolerated - Touch base with your insurance company regarding CPT 0232T (PRP injection) and reach out to [place] to provide an update - Can review the following PRP information - Return for follow-up in 6 weeks    Platelet-Rich Plasma (PRP) Patient Information  Platelet-rich plasma is used in musculoskeletal medicine to focus your own body's ability to heal. It has several well-done published randomized control trials (RCT) which demonstrate both its effectiveness and safety in many musculoskeletal conditions, including osteoarthritis, tendinopathies, and damaged vertebral discs. PRP has been in clinical use since the 1990's.  Many people know that platelets form a clot if there is a cut in the skin. It turns out that platelets do not only form a clot, but they also start the body's own repair process. When platelets activate to form a clot, they also release alpha granules which have hundreds of chemical messengers in them that initiate and organize repair to the damaged tissue.  Precisely placing PRP at the site of injury will initiate the healing process by activating on the damaged cartilage or tendon. This is an inflammatory process, and inflammation is the vital first phase of healing.  What to expect and how to prepare for PRP   2 weeks prior to the procedure: depending on the procedure, you may need to arrange for a driver to bring you home. IF you are having a lower extremity procedure, we can provide crutches as needed.   7 days prior to the procedure: Stop taking anti-inflammatory drugs like ibuprofen, Naprosyn, Celebrex, or Meloxicam. Let Dr. Zigmund Daniel know if you have been taking prednisone or other corticosteroids in the  last month.   The day before the procedure: thoroughly shower and clean your skin.    The day of the procedure: Wear loose-fitting clothing like sweatpants or shorts. If you are having an upper body procedure wear a top that can button or zip up.  PRP will initiate healing and a productive inflammation, and PRP therapy will make the body part treated sore for 4 days to two weeks.  Anti-inflammatory drugs (i.e. ibuprofen, Naprosyn, Celebrex) and corticosteroids such as prednisone can blunt or stop this process, so it is important to not take any anti-inflammatory drugs for 7 days before getting PRP therapy, and for at least 3 weeks after PRP therapy.  Corticosteroid injections can blunt inflammation for 30 days, so let us know if you have had one recently.  Depending on the body part injected, you may be in a sling or on crutches for several days. Just like wringing out a wet dishcloth, if you load or tense a tendon or ligament that has just been injected with PRP, some of the PRP injected can squeeze out. By keeping the body part treated relaxed for a few days, the PRP can bind in place and do its job.   You may need a driver to bring you home.  Tobacco/nicotine is a potent toxin and its use constricts small blood vessels which are needed for tissue repair. Tobacco/nicotine use will limit the effectiveness of any treatment and stopping tobacco use is one of the single greatest actions you can take to improve your health. Avoid toxins like alcohol, which inhibits and depresses the cells  needed for tissue repair.  What happens during the PRP procedure?  Platelet-rich plasma is made by taking some of your blood and performing a two-stage centrifuge process on it to concentrate the PRP. First, your blood is drawn into a syringe with a small amount of anti-coagulant in it (this is to keep the blood from clotting during this process). The amount of blood drawn is usually about 10-30 milliliters,  depending on how much PRP is needed for the treatment. (There are 355 milliliters in a 12-ounce soda can for comparison).   Then the blood is transferred in a sterile fashion into a centrifuge tube. It is then centrifuged for the first cycle where the red blood cells are isolated and discarded. In the second centrifuge cycle, the platelet-rich fraction of the remaining plasma is concentrated and placed in a syringe.  The skin at the injection site is numbed with a small amount of topical cooling spray. Dr Zigmund Daniel will then precisely inject the PRP into the injury site using ultrasound guidance.  What to do after your procedure  I will give you specific medicine to control any discomfort you may have after the procedure.  Avoid NSAIDs like ibuprofen.  Acetaminophen can be used for mild pain.   Do your best not to tense or load the treated area during this time. After 3 days, unless otherwise instructed, the treated body part should be used and slowly moved through its full range of motion. It will be sore, but you will not be doing damage by moving it, in fact it needs to move to heal.  If you were on crutches for some time, walking is okay once you are off the crutches. For now, avoid activities that specifically hurt you before being treated. Exercise is vital to good health and finding a way to cross train around your injury is important not only for your physical health, but for your mental health as well. Ask me about cross training options for your injury.  Some brief (10 minutes or less) period of heat or ice therapy will not hurt the therapy, but it is not required.  Usually, depending on the initial injury, physical therapy is started from two weeks to four weeks after injection. Improvements in pain and function should be expected from 8 weeks to 12 weeks after injection and some injuries may require more than one treatment.   Montel Culver, MD, [facility]   Primary Care Sports  Medicine Primary Care and Sports Medicine at [facility]

## 2022-04-07 ENCOUNTER — Ambulatory Visit: Payer: BC Managed Care – PPO | Admitting: Family Medicine

## 2023-02-24 ENCOUNTER — Encounter: Payer: Self-pay | Admitting: Emergency Medicine

## 2023-02-24 ENCOUNTER — Ambulatory Visit
Admission: EM | Admit: 2023-02-24 | Discharge: 2023-02-24 | Disposition: A | Discharge status: Home / Self Care | Payer: Commercial Managed Care - PPO | Attending: Family Medicine | Admitting: Family Medicine

## 2023-02-24 DIAGNOSIS — J40 Bronchitis, not specified as acute or chronic: Secondary | ICD-10-CM

## 2023-02-24 DIAGNOSIS — J329 Chronic sinusitis, unspecified: Secondary | ICD-10-CM | POA: Diagnosis not present

## 2023-02-24 MED ORDER — PREDNISONE 10 MG (21) PO TBPK: ORAL_TABLET | Freq: Every day | ORAL | 0 refills | Status: AC

## 2023-02-24 MED ORDER — AMOXICILLIN-POT CLAVULANATE 875-125 MG PO TABS: 1.0000 | ORAL_TABLET | Freq: Two times a day (BID) | ORAL | 0 refills | Status: AC

## 2023-02-24 NOTE — ED Provider Notes (Signed)
 [facility]    CSN: [id number] Arrival date & time: 02/24/23  0945      History   Chief Complaint Chief Complaint  Patient presents with   Cough    HPI Rodney Weber is a 53 y.o. male.   HPI  History obtained from the patient. Kitt presents for chest congestion, nasal congestion with productive cough and non-bloody sputum. Took Mucinex and coricidin HBP with some short term relief.  His kids and wife have been sick with similar sx but he has had it the longest.  He felt warm but didn't think he had a fever.    No asthma history.  States he scarring.   History reviewed. No pertinent past medical history.  Patient Active Problem List   Diagnosis Date Noted   Lateral epicondylitis, right elbow 01/27/2022    Past Surgical History:  Procedure Laterality Date   EXCISION PERITONSILLAR CYST  1989       Home Medications    Prior to Admission medications   Medication Sig Start Date End Date Taking? Authorizing Provider  amoxicillin -clavulanate (AUGMENTIN ) 875-125 MG tablet Take 1 tablet by mouth every 12 (twelve) hours. 02/24/23  Yes Rodney Geralds, DO  predniSONE  (STERAPRED UNI-PAK 21 TAB) 10 MG (21) TBPK tablet Take by mouth daily. Take 6 tabs by mouth daily for 1, then 5 tabs for 1 day, then 4 tabs for 1 day, then 3 tabs for 1 day, then 2 tabs for 1 day, then 1 tab for 1 day. 02/24/23  Yes Rodney Bernhard, DO  meloxicam  (MOBIC ) 7.5 MG tablet Take 1 tablet (7.5 mg total) by mouth 2 (two) times daily as needed for pain. 02/24/22   Rodney Selinda PARAS, MD    Family History History reviewed. No pertinent family history.  Social History Social History   Tobacco Use   Smoking status: Never   Smokeless tobacco: Current    Types: Chew  Vaping Use   Vaping status: Never Used  Substance Use Topics   Alcohol use: Yes    Comment: rare   Drug use: No     Allergies   Iodinated contrast media and Other   Review of Systems Review of Systems: negative  unless otherwise stated in HPI.      Physical Exam Triage Vital Signs ED Triage Vitals  Encounter Vitals Group     BP 02/24/23 1018 110/78     Systolic BP Percentile --      Diastolic BP Percentile --      Pulse Rate 02/24/23 1018 75     Resp 02/24/23 1018 15     Temp 02/24/23 1018 98.8 F (37.1 C)     Temp Source 02/24/23 1018 Oral     SpO2 02/24/23 1018 100 %     Weight 02/24/23 1017 223 lb 1.7 oz (101.2 kg)     Height 02/24/23 1017 6' (1.829 m)     Head Circumference --      Peak Flow --      Pain Score 02/24/23 1017 0     Pain Loc --      Pain Education --      Exclude from Growth Chart --    No data found.  Updated Vital Signs BP 110/78 (BP Location: Left Arm)   Pulse 75   Temp 98.8 F (37.1 C) (Oral)   Resp 15   Ht 6' (1.829 m)   Wt 101.2 kg   SpO2 100%   BMI 30.26 kg/m  Visual Acuity Right Eye Distance:   Left Eye Distance:   Bilateral Distance:    Right Eye Near:   Left Eye Near:    Bilateral Near:     Physical Exam GEN:     alert, well appearing male in no distress    HENT:  mucus membranes moist, oropharyngeal without lesions, mild erythema, no tonsillar hypertrophy or exudates,  moderate erythematous edematous turbinates, clear nasal discharge, bilateral TM normal EYES:   no scleral injection or discharge RESP:  no increased work of breathing, faint expiratory wheezing at the bases CVS:   regular rate and rhythm Skin:   warm and dry, no rash on visible skin    UC Treatments / Results  Labs (all labs ordered are listed, but only abnormal results are displayed) Labs Reviewed - No data to display  EKG   Radiology No results found.  Procedures Procedures (including critical care time)  Medications Ordered in UC Medications - No data to display  Initial Impression / Assessment and Plan / UC Course  I have reviewed the triage vital signs and the nursing notes.  Pertinent labs & imaging results that were available during my care of  the patient were reviewed by me and considered in my medical decision making (see chart for details).       Pt is a 53 y.o. male who presents for 3 weeks of cough that is not improving.  Rodney Weber is  afebrile here without recent antipyretics. Satting  well on room air. Overall pt is  non-toxic appearing, well hydrated, without respiratory distress. Pulmonary exam is remarkable for faint expiratory wheezing at the lung bases.  Patient declined a chest x-ray.  COVID  and influenza testing deferred due to length of symptoms.   Treat acute sinobronchitis with steroids and antibiotics as below. Typical duration of symptoms discussed. Return and ED precautions given and patient voiced understanding.   Discussed MDM, treatment plan and plan for follow-up with patient who agrees with plan.     Final Clinical Impressions(s) / UC Diagnoses   Final diagnoses:  Sinobronchitis     Discharge Instructions      Stop by the pharmacy to pick up your prescriptions.  Follow up with your primary care provider as needed.      ED Prescriptions     Medication Sig Dispense Auth. Provider   amoxicillin -clavulanate (AUGMENTIN ) 875-125 MG tablet Take 1 tablet by mouth every 12 (twelve) hours. 14 tablet Rodney Vallin, DO   predniSONE  (STERAPRED UNI-PAK 21 TAB) 10 MG (21) TBPK tablet Take by mouth daily. Take 6 tabs by mouth daily for 1, then 5 tabs for 1 day, then 4 tabs for 1 day, then 3 tabs for 1 day, then 2 tabs for 1 day, then 1 tab for 1 day. 21 tablet Rodney Todorov, DO      PDMP not reviewed this encounter.   Rodney Barley, DO 02/24/23 1034

## 2023-02-24 NOTE — ED Triage Notes (Signed)
 Patient reports cough and chest congestion for 3 weeks.  Patient unsure of fevers.

## 2023-02-24 NOTE — Discharge Instructions (Addendum)
 Stop by the pharmacy to pick up your prescriptions.  Follow up with your primary care provider as needed.

## 2023-07-12 ENCOUNTER — Other Ambulatory Visit: Payer: Self-pay | Admitting: Gerontology

## 2023-07-12 DIAGNOSIS — Z136 Encounter for screening for cardiovascular disorders: Secondary | ICD-10-CM

## 2023-07-12 DIAGNOSIS — Z8249 Family history of ischemic heart disease and other diseases of the circulatory system: Secondary | ICD-10-CM

## 2023-07-19 ENCOUNTER — Ambulatory Visit
Admission: RE | Admit: 2023-07-19 | Discharge: 2023-07-19 | Disposition: A | Discharge status: Home / Self Care | Source: Ambulatory Visit | Attending: Gerontology | Admitting: Gerontology

## 2023-07-19 DIAGNOSIS — Z8249 Family history of ischemic heart disease and other diseases of the circulatory system: Secondary | ICD-10-CM | POA: Diagnosis present

## 2023-07-19 DIAGNOSIS — Z136 Encounter for screening for cardiovascular disorders: Secondary | ICD-10-CM | POA: Insufficient documentation
